# Patient Record
Sex: Male | Born: 1975 | Race: Black or African American | Hispanic: No | Marital: Single | Smoking: Never smoker
Health system: Southern US, Academic
[De-identification: ages and names within clinical notes are randomized; demographics above are authoritative.]

---
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
## 2022-05-23 ENCOUNTER — Inpatient Hospital Stay (HOSPITAL_COMMUNITY): Payer: MEDICAID

## 2022-05-23 ENCOUNTER — Other Ambulatory Visit: Payer: Self-pay

## 2022-05-23 ENCOUNTER — Emergency Department
Admission: EM | Admit: 2022-05-23 | Discharge: 2022-05-23 | Payer: MEDICAID | Attending: Student in an Organized Health Care Education/Training Program | Admitting: Student in an Organized Health Care Education/Training Program

## 2022-05-23 ENCOUNTER — Encounter (HOSPITAL_COMMUNITY): Payer: Self-pay

## 2022-05-23 DIAGNOSIS — S8991XA Unspecified injury of right lower leg, initial encounter: Secondary | ICD-10-CM | POA: Insufficient documentation

## 2022-05-23 DIAGNOSIS — X58XXXA Exposure to other specified factors, initial encounter: Secondary | ICD-10-CM | POA: Insufficient documentation

## 2022-05-23 NOTE — Discharge Instructions (Signed)
Rest, ice compression, elevate affected extremity.  Take Tylenol and/or ibuprofen as needed for pain control.  Were immobilizer for 7 days as well as use crutches for 7 days.  For continued pain follow up with orthopedist.  Follow up with your primary care provider for close ED follow-up for continued pain.    Thank you for allowing [place] to be part of your care.    Please discuss all medications with your pharmacist to ensure there are no concerns of interactions.    Please ensure all questions or concerns are addressed prior to leaving the hospital. We want to make sure your concerns are addressed to make sure you are as safe and healthy as possible. By leaving the hospital, it is understood you are in agreement with your treatment plan.    You may have received sedating medication during your visit. Please discuss this with your discharging provider nurse as you may not be able to operate machines while the medication is in your system, or while you are taking any potentially sedating prescriptions.    Please call the hospital medical records office for a copy of your finalized results, and review them with a primary care physician, for any findings needing further attention.    If you feel your situation worsens, or does not get better in 48 hours, please see a physician for evaluation.    We encourage you to see your regular doctor as soon as possible to let them know you were seen in the emergency department. They may want to do further testing. If you do not have a doctor, please feel free to call the hospital, and ask for contact information of accepting providers. Please also discuss your vaccinations, and ensure all are up to date.    You may use this document to take today off work or school.
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
## 2022-05-23 NOTE — ED Nurses Note (Signed)
Knee immobilizer applied. Crutches provided. Pt given instructions on how to use crutches. Pt verbalizes understanding.

## 2022-05-23 NOTE — ED Triage Notes (Signed)
SENT FROM [facility] FOR KNEE INJURY, POSSIBLE DISLOCATION.  SPLIT, 20G LAC, 75MCG FENTANYL, '4MG'$  ZOFRAN.

## 2022-05-23 NOTE — ED Nurses Note (Signed)
Patient discharged to [facility] with Designer, industrial/product.  AVS reviewed with patient/CO.  A written copy of the AVS and discharge instructions was given to the patient/CO.  Questions sufficiently answered as needed.  Patient/CO encouraged to follow up with PCP as indicated.  In the event of an emergency, patient/CO instructed to call 911 or go to the nearest emergency room.

## 2022-08-01 ENCOUNTER — Emergency Department
Admission: EM | Admit: 2022-08-01 | Discharge: 2022-08-01 | Disposition: A | Discharge status: Home / Self Care | Payer: Medicaid Other | Attending: Physician Assistant | Admitting: Physician Assistant

## 2022-08-01 ENCOUNTER — Encounter (HOSPITAL_COMMUNITY): Payer: Self-pay | Admitting: Physician Assistant

## 2022-08-01 ENCOUNTER — Emergency Department (HOSPITAL_COMMUNITY): Payer: Medicaid Other

## 2022-08-01 ENCOUNTER — Other Ambulatory Visit: Payer: Self-pay

## 2022-08-01 DIAGNOSIS — M25561 Pain in right knee: Secondary | ICD-10-CM | POA: Insufficient documentation

## 2022-08-01 DIAGNOSIS — X501XXA Overexertion from prolonged static or awkward postures, initial encounter: Secondary | ICD-10-CM | POA: Insufficient documentation

## 2022-08-01 DIAGNOSIS — M25461 Effusion, right knee: Secondary | ICD-10-CM | POA: Insufficient documentation

## 2022-08-01 DIAGNOSIS — M25469 Effusion, unspecified knee: Secondary | ICD-10-CM

## 2022-08-01 DIAGNOSIS — Y9367 Activity, basketball: Secondary | ICD-10-CM | POA: Insufficient documentation

## 2022-08-01 DIAGNOSIS — G8929 Other chronic pain: Secondary | ICD-10-CM

## 2022-08-01 DIAGNOSIS — M25569 Pain in unspecified knee: Secondary | ICD-10-CM

## 2022-08-01 DIAGNOSIS — R2242 Localized swelling, mass and lump, left lower limb: Secondary | ICD-10-CM

## 2022-08-01 MED ORDER — KETOROLAC 60 MG/2 ML INTRAMUSCULAR SOLUTION
INTRAMUSCULAR | Status: AC
Start: 2022-08-01 — End: 2022-08-01
  Filled 2022-08-01: qty 2

## 2022-08-01 MED ORDER — KETOROLAC 60 MG/2 ML INTRAMUSCULAR SOLUTION
60.0000 mg | INTRAMUSCULAR | Status: AC
Start: 2022-08-01 — End: 2022-08-01
  Administered 2022-08-01: 60 mg via INTRAMUSCULAR

## 2022-08-01 MED ORDER — IBUPROFEN 800 MG TABLET
800.0000 mg | ORAL_TABLET | Freq: Four times a day (QID) | ORAL | 0 refills | Status: AC | PRN
Start: 2022-08-01 — End: ?

## 2022-08-01 NOTE — Discharge Instructions (Addendum)
Apply ice to the knee for 10-15 minutes 3 to 4 times a day.  Take ibuprofen 3 times a day with food as directed.  Call schedule an appointment with orthopedist.  I encourage you to establish care with a primary care provider.  I have given you a list of providers below.    Family Practice Offices    [facility] [phone]  [facility] [phone]  [facility]-Dr. Mariah Milling [phone]  [facility]-Dr. Renelda Loma [phone]  [facility]-Dr. Victorino Sparrow [phone]  [facility]-Dr. Emilia Beck [phone]  [facility]-Dr. Earlean Shawl [phone]  [facility]- [phone]  [facility] [phone]  [facility] [phone]  [facility]-Dr. Baldomero Lamy [phone]  [facility] -Dr. Weyman Pedro [phone]  [facility]-  [phone]

## 2022-08-01 NOTE — ED Provider Notes (Signed)
[facility]  ED Primary Provider Note  History of Present Illness   Chief Complaint   Patient presents with    Chronic Pain     Francisco Chandler is a 47 y.o. male who had concerns including Chronic Pain.  Arrival: The patient arrived by Car    Patient presents to the emergency room with complaints of right knee pain and swelling for the last 3 months.  He reports that he was incarcerated in February when he was playing basketball lost his balance and twisted his right knee.  He was seen at this facility at that time and had an x-ray showing an old fracture of the patella.  Since then he has had persistent pain and swelling.  He denies that ambulating worsens the pain.  He reports that he has not able to flex his knee.  He denies prior injury prior to February of 2024.  He denies pain of the right ankle or hip.  He denies pain of the left lower extremity.        History Reviewed This Encounter: Medical History  Surgical History  Family History  Social History    Physical Exam   ED Triage Vitals [08/01/22 1550]   BP (Non-Invasive) 135/87   Heart Rate 83   Respiratory Rate 20   Temperature 36.8 C (98.3 F)   SpO2 99 %   Weight 74.8 kg (165 lb)   Height 1.768 m (5' 9.6")     Physical Exam  General: WDWN, appears stated age, alert in NAD. Cooperative throughout the exam.  Eyes:  no orbital edema or erythema, pupils equal, EOMI, no scleral icterus or conjunctival injection,  CV: good peripheral perfusion by inspection  Resp: no respiratory distress  MSK:  Inspection of the right knee shows swelling anteriorly and superiorly to the patella.  Passive flexion is limited at 20.  Extension is full.  Palpation of the right knee shows an obvious deformity along the patella.  There is no tenderness along the joint line.  Negative ballottement sign.  Skin: normal color, no lesions noted on the visible skin there is no increased warmth along the right knee.  No signs of erythema.  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 Old patellar fracture. There is inferior displacement of the fracture fragment which has increased with stable superior position of the patella.      Persistent soft tissue swelling the region of the patellar tendon. Patellar tendon pathology is not excluded. MRI of the knee should BE considered for further evaluation.                Radiologist location ID: [id number]           Medical Decision Making        Medical Decision Making  Upon arrival patient was evaluated with a history and physical exam.  Differential diagnosis includes joint effusion, arthritis, fracture, intra-articular derangement.  X-ray of the right knee was completed and compared with 2/24. It was noted that there is a small joint effusion as well as an old patellar fracture.  There is inferior displacement of  the fracture fragment meant resulting in a stable superior position of the patella.  All of this was also seen 04/2022.  The patient reports no issues with ambulation crutches were not necessary.  Patient is agreeable to an orthopedic follow-up for MRI imaging.  He does not have a primary care provider.  He is encouraged to obtain a primary care provider.  He is advised to take Motrin as prescribed for pain and return to ER if symptoms worsen or change.  Patient is agreeable plan of care.    Amount and/or Complexity of Data Reviewed  Radiology:  Decision-making details documented in ED Course.    Risk  Prescription drug management.      ED Course as of 08/01/22 2203   Mon Aug 01, 2022   1805 XR KNEE RIGHT 4 OR MORE VIEW  Persistent small joint effusion with persistent sequela of  prior infarct. Old patellar fracture. There is inferior displacement of the fracture fragment which has increased with stable superior position of the patella.     Persistent soft tissue swelling the region of the patellar tendon. Patellar tendon pathology is not excluded. MRI of the knee should BE considered for further evaluation.               Medications Administered in the ED   ketorolac (TORADOL) 60mg /2 mL IM injection (60 mg IntraMUSCULAR Given 08/01/22 1700)     Clinical Impression   Knee pain, unspecified chronicity, unspecified laterality (Primary)   Knee swelling       Disposition: Discharged
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
## 2022-08-01 NOTE — ED Triage Notes (Signed)
Chronic knee pain states unable to bend knee since injury in feb .

## 2022-08-01 NOTE — ED Nurses Note (Signed)
Pt given DC instructions. Discussed f/u and prescriptions given. Answered all questions and concerns. Pt voiced understanding. Pt A&OX4. Ambulated from ED with family.

## 2022-09-16 ENCOUNTER — Emergency Department
Admission: EM | Admit: 2022-09-16 | Discharge: 2022-09-17 | Disposition: A | Discharge status: Home / Self Care | Payer: MEDICAID | Attending: Emergency Medicine | Admitting: Emergency Medicine

## 2022-09-16 ENCOUNTER — Encounter (HOSPITAL_COMMUNITY): Payer: Self-pay | Admitting: Emergency Medicine

## 2022-09-16 ENCOUNTER — Other Ambulatory Visit: Payer: Self-pay

## 2022-09-16 DIAGNOSIS — R109 Unspecified abdominal pain: Secondary | ICD-10-CM

## 2022-09-16 DIAGNOSIS — R1084 Generalized abdominal pain: Secondary | ICD-10-CM | POA: Insufficient documentation

## 2022-09-16 DIAGNOSIS — R112 Nausea with vomiting, unspecified: Secondary | ICD-10-CM | POA: Insufficient documentation

## 2022-09-16 LAB — CBC WITH DIFF
BASOPHIL #: 0.1 10*3/uL (ref 0.00–0.10)
BASOPHIL %: 1 % (ref 0–1)
EOSINOPHIL #: 0.1 10*3/uL (ref 0.00–0.50)
EOSINOPHIL %: 1 %
HCT: 50.9 % — ABNORMAL HIGH (ref 36.7–47.1)
HGB: 17 g/dL — ABNORMAL HIGH (ref 12.5–16.3)
LYMPHOCYTE #: 8.4 10*3/uL — ABNORMAL HIGH (ref 1.00–3.00)
LYMPHOCYTE %: 54 % — ABNORMAL HIGH (ref 16–44)
MCH: 30.8 pg (ref 23.8–33.4)
MCHC: 33.5 g/dL (ref 32.5–36.3)
MCV: 92.2 fL (ref 73.0–96.2)
MONOCYTE #: 1.2 10*3/uL — ABNORMAL HIGH (ref 0.30–1.00)
MONOCYTE %: 8 % (ref 5–13)
MPV: 8.3 fL (ref 7.4–11.4)
NEUTROPHIL #: 5.7 10*3/uL (ref 1.85–7.80)
NEUTROPHIL %: 37 % — ABNORMAL LOW (ref 43–77)
PLATELETS: 249 10*3/uL (ref 140–440)
RBC: 5.52 10*6/uL (ref 4.06–5.63)
RDW: 14.9 % (ref 12.1–16.2)
WBC: 15.5 10*3/uL — ABNORMAL HIGH (ref 3.6–10.2)

## 2022-09-16 LAB — COMPREHENSIVE METABOLIC PANEL, NON-FASTING
ALBUMIN/GLOBULIN RATIO: 1.1 (ref 0.8–1.4)
ALBUMIN: 4.2 g/dL (ref 3.5–5.7)
ALKALINE PHOSPHATASE: 66 U/L (ref 34–104)
ALT (SGPT): 105 U/L — ABNORMAL HIGH (ref 7–52)
ANION GAP: 10 mmol/L (ref 4–13)
AST (SGOT): 82 U/L — ABNORMAL HIGH (ref 13–39)
BILIRUBIN TOTAL: 0.8 mg/dL (ref 0.3–1.2)
BUN/CREA RATIO: 6 (ref 6–22)
BUN: 7 mg/dL (ref 7–25)
CALCIUM, CORRECTED: 9 mg/dL (ref 8.9–10.8)
CALCIUM: 9.2 mg/dL (ref 8.6–10.3)
CHLORIDE: 97 mmol/L — ABNORMAL LOW (ref 98–107)
CO2 TOTAL: 26 mmol/L (ref 21–31)
CREATININE: 1.26 mg/dL (ref 0.60–1.30)
ESTIMATED GFR: 72 mL/min/{1.73_m2} (ref >59)
GLOBULIN: 3.8 (ref 2.9–5.4)
GLUCOSE: 108 mg/dL (ref 74–109)
OSMOLALITY, CALCULATED: 265 mOsm/kg — ABNORMAL LOW (ref 270–290)
POTASSIUM: 3.6 mmol/L (ref 3.5–5.1)
PROTEIN TOTAL: 8 g/dL (ref 6.4–8.9)
SODIUM: 133 mmol/L — ABNORMAL LOW (ref 136–145)

## 2022-09-16 LAB — URINALYSIS, MACROSCOPIC
BILIRUBIN: 0.5 mg/dL
BLOOD: NEGATIVE mg/dL
GLUCOSE: NEGATIVE mg/dL
KETONES: 40 mg/dL — AB
LEUKOCYTES: NEGATIVE WBCs/uL
NITRITE: NEGATIVE
PH: 6 (ref 5.0–9.0)
PROTEIN: 200 mg/dL — AB
SPECIFIC GRAVITY: 1.036 — ABNORMAL HIGH (ref 1.002–1.030)
UROBILINOGEN: 8 mg/dL — AB

## 2022-09-16 LAB — URINALYSIS, MICROSCOPIC
HYALINE CASTS: 3 /lpf — ABNORMAL HIGH (ref <0)
RBCS: 1 /hpf (ref <4)
SQUAMOUS EPITHELIAL: <1 /hpf (ref <28)
WBCS: 5 /hpf (ref <6)

## 2022-09-16 LAB — LACTIC ACID LEVEL W/ REFLEX FOR LEVEL >2.0: LACTIC ACID: 1.3 mmol/L (ref 0.5–2.2)

## 2022-09-16 LAB — LIPASE: LIPASE: 43 U/L (ref 11–82)

## 2022-09-16 LAB — BLUE TOP TUBE

## 2022-09-16 MED ORDER — PANTOPRAZOLE 40 MG INTRAVENOUS SOLUTION
40.0000 mg | INTRAVENOUS | Status: AC
Start: 2022-09-17 — End: 2022-09-16
  Administered 2022-09-16: 40 mg via INTRAVENOUS

## 2022-09-16 MED ORDER — ONDANSETRON HCL (PF) 4 MG/2 ML INJECTION SOLUTION
INTRAMUSCULAR | Status: AC
Start: 2022-09-16 — End: 2022-09-16
  Filled 2022-09-16: qty 2

## 2022-09-16 MED ORDER — ONDANSETRON HCL (PF) 4 MG/2 ML INJECTION SOLUTION
4.0000 mg | INTRAMUSCULAR | Status: AC
Start: 2022-09-17 — End: 2022-09-16
  Administered 2022-09-16: 4 mg via INTRAVENOUS

## 2022-09-16 MED ORDER — MORPHINE 4 MG/ML INJECTION WRAPPER
INJECTION | INTRAMUSCULAR | Status: AC
Start: 2022-09-16 — End: 2022-09-16
  Filled 2022-09-16: qty 1

## 2022-09-16 MED ORDER — MORPHINE 4 MG/ML INJECTION WRAPPER
4.0000 mg | INJECTION | INTRAMUSCULAR | Status: AC
Start: 2022-09-17 — End: 2022-09-16
  Administered 2022-09-16: 4 mg via INTRAVENOUS

## 2022-09-16 MED ORDER — PANTOPRAZOLE 40 MG INTRAVENOUS SOLUTION
INTRAVENOUS | Status: AC
Start: 2022-09-16 — End: 2022-09-16
  Filled 2022-09-16: qty 10

## 2022-09-16 NOTE — ED Provider Notes (Signed)
[place] [facility]  ED Primary Provider Note        Arrival: The patient arrived by Car     History of Present Illness   chief complaint  Francisco Chandler is a 47 y.o. male who had concerns including Vomiting.  Patient was a 47 year old male who presents emergency room with a chief complaint diffuse abdominal pain x1 week.  He was states the pain is crampy in nature.  Patient was rates pain currently has a 6/10 at worst it is 9/10.  He has had associated nausea and vomiting.  States he was having difficulty tolerating anything p.o..  Denies any prior abdominal problems.  No abdominal surgeries.  No regular medications.  No known ill contacts.  No dysuria, no diarrhea.  All nursing notes reviewed        Review of Systems     No other overt Review of Systems are noted to be positive except noted in the HPI.    { Be sure to review and modify ROS as appropriate. As of Mar 28, 2021 you are only required to have a "medically appropriate" ROS and PE for billing purposes. This help text will disappear when signing your note.:123}  Historical Data   History Reviewed This Encounter: Medical History  Surgical History  Family History  Social History      Physical Exam   ED Triage Vitals [09/16/22 1959]   BP (Non-Invasive) (!) 140/98   Heart Rate 94   Respiratory Rate 18   Temperature 37.4 C (99.3 F)   SpO2 98 %   Weight 72.6 kg (160 lb)   Height 1.727 m (5\' 8" )     {Be sure to review and modify Physical Exam as appropriate. As of Mar 28, 2021 you are only required to have a "medically appropriate" ROS and PE for billing purposes. This help text will disappear when signing your note.:123}    Exam:   Constitutional:  Patient alert orient x3 in no apparent distress.  No limitations.  Head: Atraumatic normocephalic  Eyes :  Pupils are equal round reactive to light and accommodation extraocular muscles are intact.  Sclera and conjunctiva are unremarkable  Ears:  Tympanic membranes are pearly gray  bilaterally; external auditory canals are unremarkable; external ears without any lesions  Nose:  Nares are patent turbinates are pink and moist  Mouth:  Mucosa is pink and moist without lesions.  Posterior pharynx is pink and moist without hypertrophy/exudate.  Neck:  Soft and supple without palpable lymphadenopathy.  Heart:  Regular rate and rhythm without audible murmur  Lungs:  Clear to auscultation bilaterally without any wheezing/rales/rhonchi  Abdomen:  Abdomen is soft.  It was moderate diffuse tenderness.  No rebound or guarding.  Good bowel sounds    Genitalia:  Deferred  Skin:  Warm and dry without lesions.  Normal skin turgor.  Brisk capillary refill distally  Extremities:  Good strength bilaterally with full range of motion of upper and lower extremities.  Neuro:  Alert oriented x3.  Cranial nerves II-XII grossly intact as tested.  Excellent sensation distally over all dermatomes.  Psychiatric:  Patient cooperative, affect appropriate, insight and judgment good          Procedures           Patient Data   {Click here to open the ED Workup Activity for clinical data review *This link will automatically disappear upon signing your note*:123}  Labs Ordered/Reviewed   COMPREHENSIVE METABOLIC PANEL, NON-FASTING -  Abnormal; Notable for the following components:       Result Value    SODIUM 133 (*)     CHLORIDE 97 (*)     ALT (SGPT) 105 (*)     AST (SGOT) 82 (*)     OSMOLALITY, CALCULATED 265 (*)     All other components within normal limits    Narrative:     Estimated Glomerular Filtration Rate (eGFR) is calculated using the CKD-EPI (2021) equation, intended for patients 28 years of age and older. If gender is not documented or "unknown", there will be no eGFR calculation.     CBC WITH DIFF - Abnormal; Notable for the following components:    WBC 15.5 (*)     HGB 17.0 (*)     HCT 50.9 (*)     NEUTROPHIL % 37 (*)     LYMPHOCYTE % 54 (*)     LYMPHOCYTE # 8.40 (*)     MONOCYTE # 1.20 (*)     All other components  within normal limits   LIPASE - Normal   LACTIC ACID LEVEL W/ REFLEX FOR LEVEL >2.0 - Normal   CBC/DIFF    Narrative:     The following orders were created for panel order CBC/DIFF.  Procedure                               Abnormality         Status                     ---------                               -----------         ------                     CBC WITH YQMV[[id number]]                Abnormal            Final result                 Please view results for these tests on the individual orders.   BLUE TOP TUBE   URINALYSIS, MACROSCOPIC AND MICROSCOPIC W/CULTURE REFLEX    Narrative:     The following orders were created for panel order URINALYSIS, MACROSCOPIC AND MICROSCOPIC W/CULTURE REFLEX.  Procedure                               Abnormality         Status                     ---------                               -----------         ------                     URINALYSIS, MACROSCOPIC[[id number]]  URINALYSIS, MICROSCOPIC[[id number]]                                                       Please view results for these tests on the individual orders.   URINALYSIS, MACROSCOPIC   URINALYSIS, MICROSCOPIC   EXTRA TUBES    Narrative:     The following orders were created for panel order EXTRA TUBES.  Procedure                               Abnormality         Status                     ---------                               -----------         ------                     BLUE TOP ZOXW[[id number]]                                    Final result               GOLD TOP JXBJ[[id number]]                                    In process                   Please view results for these tests on the individual orders.   GOLD TOP TUBE       No orders to display       Medical Decision Making     ED clinical impression missing, please click on the following link to add Clinical Impressions ***  then refresh the note prior to signing.  {Be sure to fill out the MDM SmartBlock in  Notewriter to the left. Do not modify this italicized text, it will disappear upon signing your note:123}  Medical Decision Making                  {Disposition:38159}    {Critical Care Time (Optional):37527}           ED clinical impression missing, please click on the following link to add Clinical Impressions ***  then refresh the note prior to signing.      Current Discharge Medication List          R.A. Bobbijo Holst, DO  Department of Emergency Medicine              {Remember to refresh your note prior to signing. Use Control + F11 or click the refresh button at the bottom of the note. This reminder text will automatically disappear when you sign your note.:123}

## 2022-09-16 NOTE — ED APP Handoff Note (Signed)
[place] [facility]  Emergency Department  Provider in Triage Note    Name: Francisco Chandler  Age: 47 y.o.  Gender: male     Subjective:   Francisco Chandler is a 47 y.o. male who presents with complaint of Vomiting  .  Pt presents to the ER for evaluation of left abd pain, n/v x 1 week. He denies fever, chills. He denies diarrhea. He states he has had decreased urine output. Nothing makes s/s better or worse.     Objective:   Filed Vitals:    09/16/22 1959   BP: (!) 140/98   Pulse: 94   Resp: 18   Temp: 37.4 C (99.3 F)   SpO2: 98%      Focused Physical Exam shows pt sitting in chair. Resp even and nonlabored. He is alert and oriented.     Assessment:  A medical screening exam was completed.  This patient is a 47 y.o. male with initial findings showing Left abd pain, n/v.     Plan:  Please see initial orders and work-up below.  This is to be continued with full evaluation in the main Emergency Department.     No current facility-administered medications for this encounter.     No results found for this or any previous visit (from the past 24 hour(s)).     Roma Kayser, APRN  09/16/2022, 19:57

## 2022-09-16 NOTE — ED Triage Notes (Signed)
Pt states for week has been vomiting. Pain on left flank. No pain with urination, denies diarrhea. Unable to hold any food down.

## 2022-09-17 ENCOUNTER — Emergency Department (HOSPITAL_COMMUNITY): Payer: MEDICAID

## 2022-09-17 LAB — DRUG SCREEN, NO CONFIRMATION, URINE
AMPHET QL: NEGATIVE
BARB QL: NEGATIVE
BENZO QL: NEGATIVE
BUP QL: NEGATIVE
CANNAQL: POSITIVE — AB
COCQL: NEGATIVE
FENTANYL, RANDOM URINE: NEGATIVE
METHQL: NEGATIVE
OPIATE: NEGATIVE
OXYCODONE URINE: NEGATIVE
PCP QL: NEGATIVE

## 2022-09-17 LAB — GOLD TOP TUBE

## 2022-09-17 MED ORDER — PANTOPRAZOLE 40 MG TABLET,DELAYED RELEASE
40.0000 mg | DELAYED_RELEASE_TABLET | Freq: Every day | ORAL | 0 refills | Status: AC
Start: 2022-09-17 — End: 2022-09-27

## 2022-09-17 MED ORDER — ONDANSETRON 4 MG DISINTEGRATING TABLET
4.0000 mg | ORAL_TABLET | Freq: Three times a day (TID) | ORAL | 0 refills | Status: AC | PRN
Start: 2022-09-17 — End: ?

## 2022-09-17 MED ORDER — IOHEXOL 350 MG IODINE/ML INTRAVENOUS SOLUTION
100.0000 mL | INTRAVENOUS | Status: AC
Start: 2022-09-17 — End: 2022-09-17
  Administered 2022-09-17: 75 mL via INTRAVENOUS

## 2022-09-17 NOTE — ED Nurses Note (Signed)
Pt was given discharge paperwork with printed scripts.  IV was taken out and intact.  No questions or concerns at this time.  Pt ambulated out of the department.

## 2022-09-17 NOTE — Discharge Instructions (Signed)
Take Zofran as needed for nausea  Take Protonix daily as prescribed for the next 10 days  Avoid marijuana  Follow-up with family doctor for recheck on Monday/Tuesday  Return to emergency room for any increased pain, not able tolerate oral fluids, or any concerns

## 2022-09-19 LAB — URINE CULTURE,ROUTINE: URINE CULTURE: 5000 — AB

## 2022-10-27 IMAGING — MR MRI KNEE RT W/O CONTRAST
5 series · 40 of 40 positions shown · IV contrast (gadolinium)
Comparison: None available.

﻿EXAM:  [id number]   MRI KNEE RT W/O CONTRAST
INDICATION: 47-year-old with anterior right knee pain. Sustained trauma playing basketball in February.  No prior knee surgery.
TECHNIQUE: Multiplanar, multisequential MRI of the right knee was performed without gadolinium contrast.

[Series 5: PD fat-sat · axial · right · 4.0mm · 0.53mm/px · z∈[-66,+65]mm · 8 of 30 slices shown (1 of 3)]
[im 1/30]
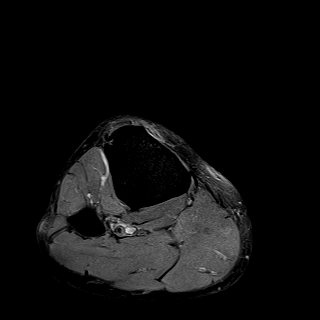
[im 5/30]
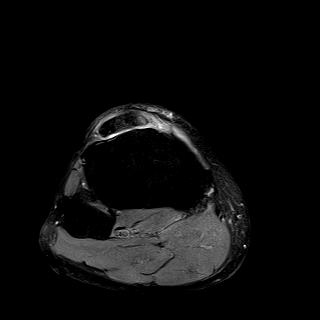
[im 9/30]
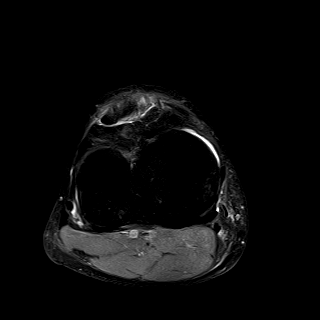
[im 13/30]
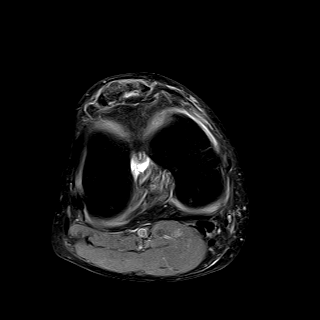
[im 17/30]
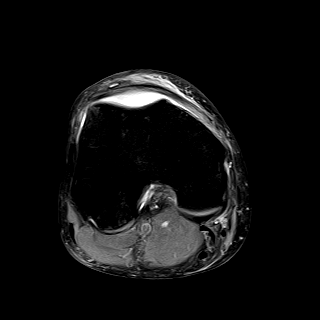
[im 21/30]
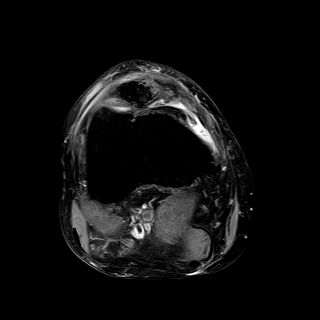
[im 25/30]
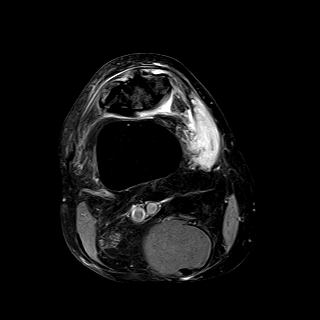
[im 30/30]
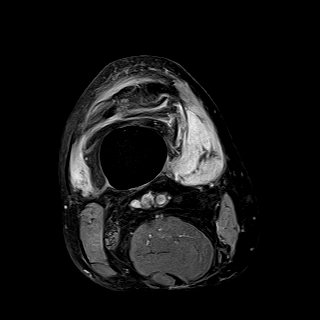

[Series 6: PD fat-sat · sagittal · right · 3.0mm · 0.47mm/px · 9 of 30 slices shown (2 of 3)]
[im 1/30]
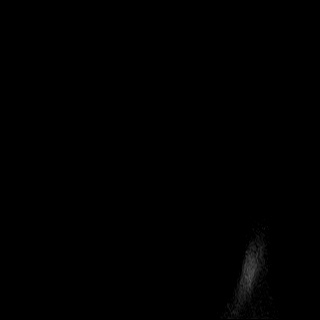
[im 4/30]
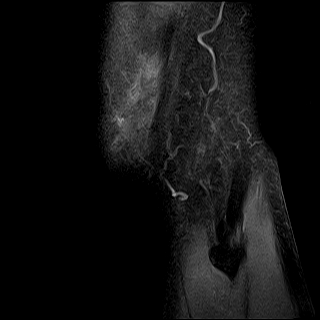
[im 8/30]
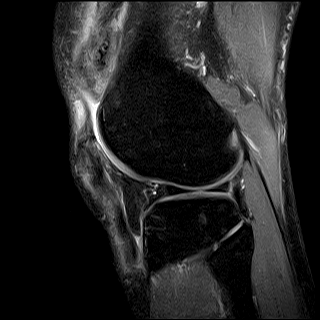
[im 11/30]
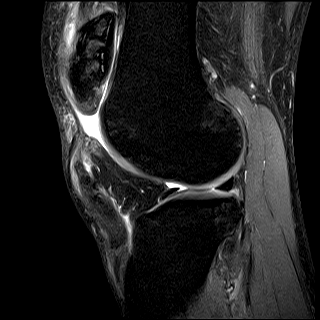
[im 15/30]
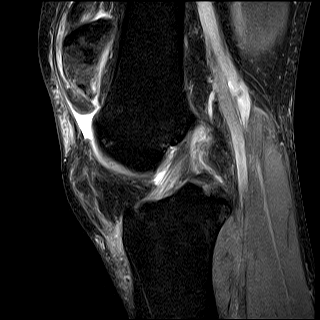
[im 19/30]
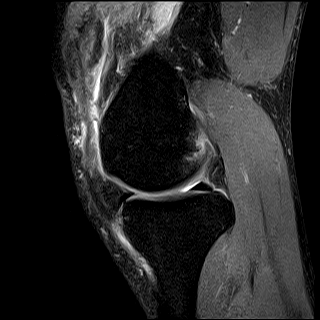
[im 22/30]
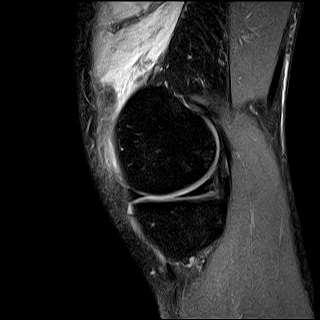
[im 26/30]
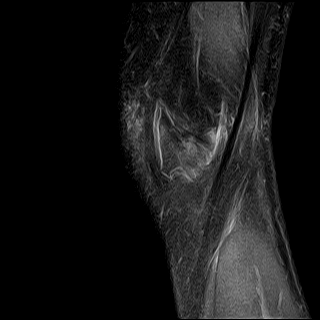
[im 30/30]
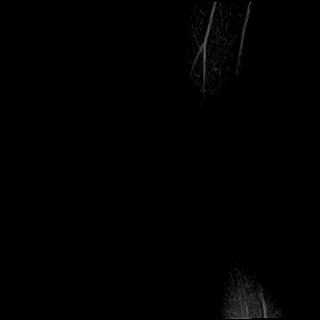

[Series 7: T1 · sagittal · right · 3.0mm · 0.39mm/px · 9 of 30 slices shown]
[im 1/30]
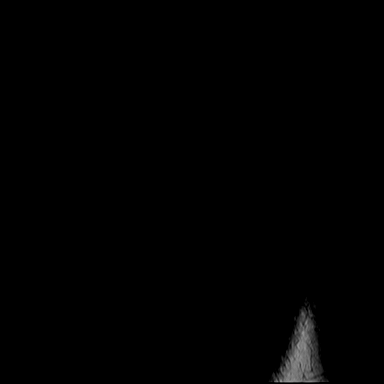
[im 4/30]
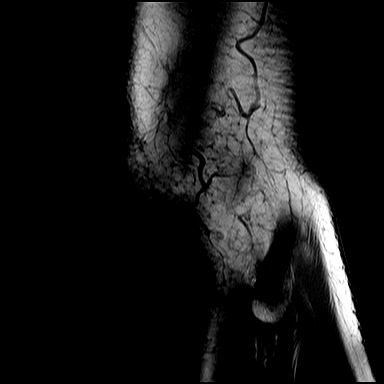
[im 8/30]
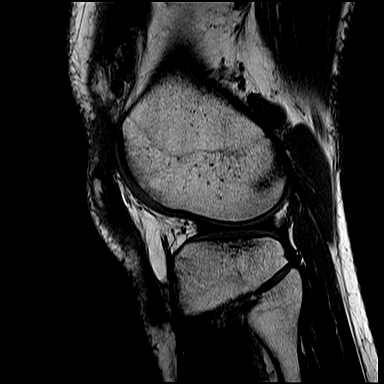
[im 11/30]
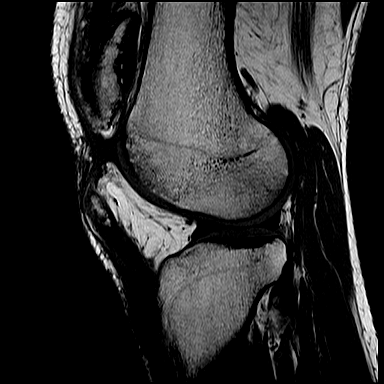
[im 15/30]
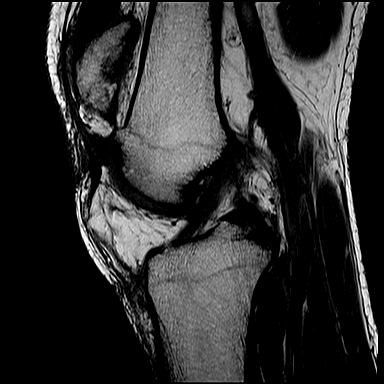
[im 19/30]
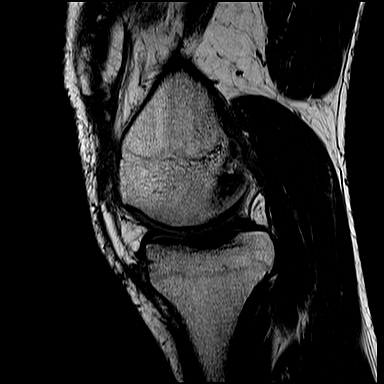
[im 22/30]
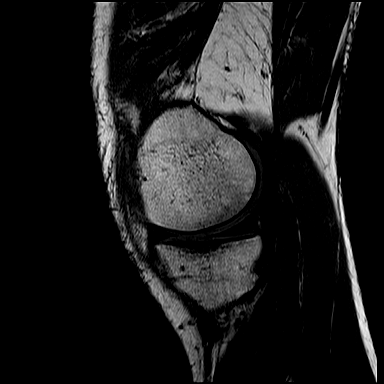
[im 26/30]
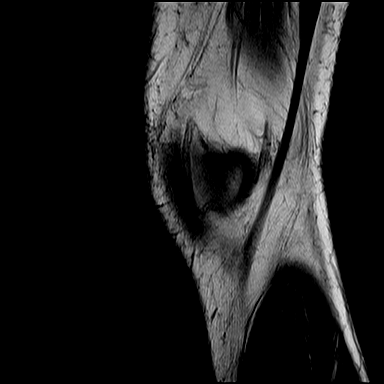
[im 30/30]
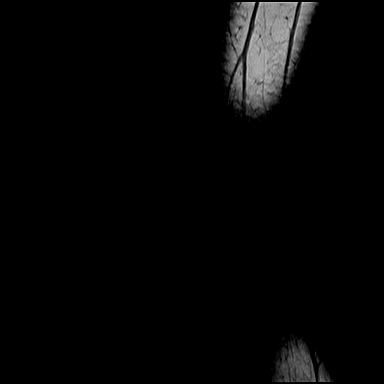

[Series 8: STIR · coronal · right · 3.5mm · 0.47mm/px · 7 of 22 slices shown]
[im 1/22]
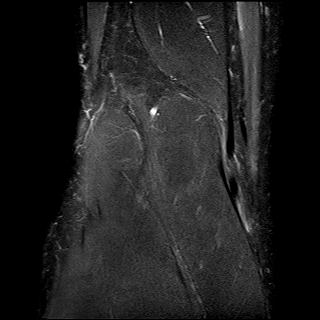
[im 4/22]
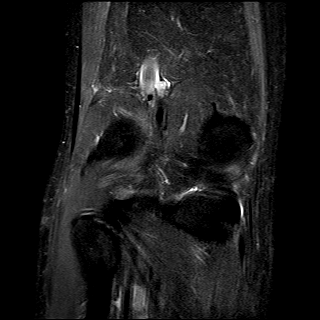
[im 8/22]
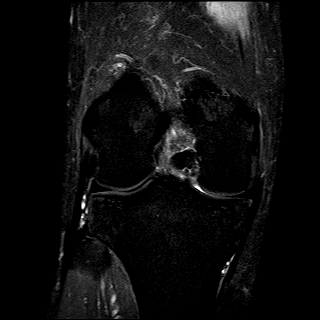
[im 11/22]
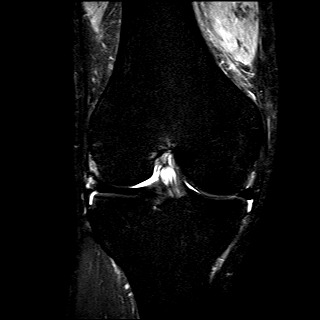
[im 15/22]
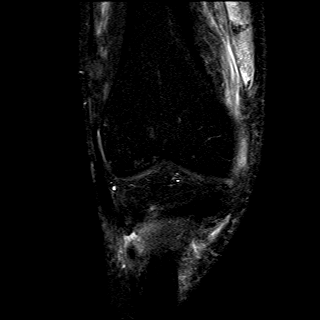
[im 18/22]
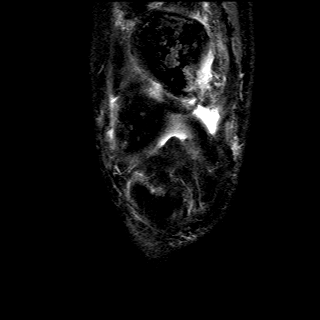
[im 22/22]
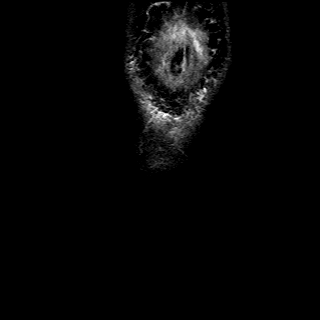

[Series 9: PD fat-sat · coronal · right · 3.5mm · 0.47mm/px · 7 of 22 slices shown (3 of 3)]
[im 1/22]
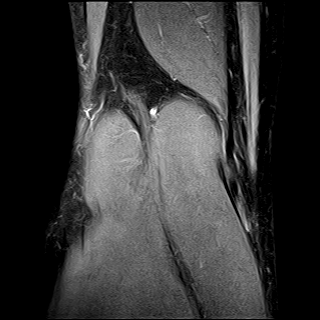
[im 4/22]
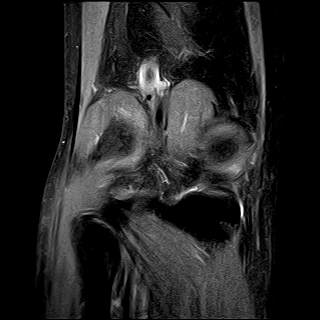
[im 8/22]
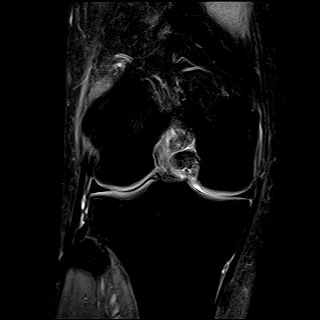
[im 11/22]
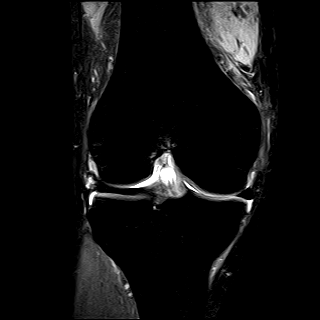
[im 15/22]
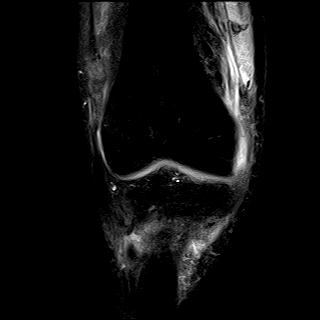
[im 18/22]
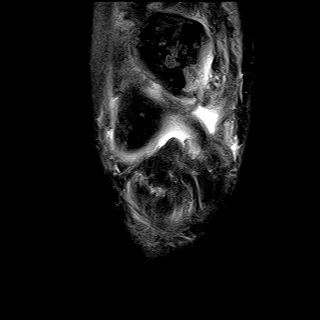
[im 22/22]
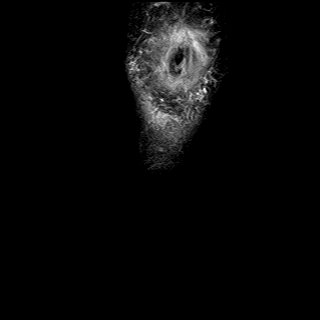

[40 of 40 positions shown; findings below may reference images not displayed]

FINDINGS: Irregular heterogeneous texture of the patella is noted suggestive of subacute to chronic changes of previous fracture of the lower pole of the patella.  Full-thickness disruption of the patellar tendon at the junction with the lower pole of the patella is noted with retraction.  Superior displacement of the patella due to disruption of the patellar tendon is noted.  Severe bruising of the quadriceps muscles is noted in the distal anterior right thigh.  Mid and distal patellar tendon shows severe tendinopathy.

Lateral meniscus, lateral articular cartilage are intact.  Anterior and posterior cruciate ligaments are intact. Medial meniscus and medial articular cartilage are intact.  Collateral ligaments are intact.

Soft tissues of the popliteal fossa are normal.  Collateral ligaments are intact.
IMPRESSION: 1. Significant changes of full-thickness disruption of the patellar tendon at the junction with the lower pole of the patella are noted with retraction of the patellar tendon.  The mid and distal patellar tendon shows severe tendinopathy.

2. Evidence of subacute fractures of the patella is noted without displacement.  Severe bruising of the quadriceps muscles is noted in the distal thigh.

## 2023-08-15 ENCOUNTER — Encounter (INDEPENDENT_AMBULATORY_CARE_PROVIDER_SITE_OTHER): Payer: Self-pay
